# Patient Record
Sex: Male | Born: 1960 | Race: White | Hispanic: No | Marital: Single | Smoking: Current every day smoker
Health system: Southern US, Community
[De-identification: ages and names within clinical notes are randomized; demographics above are authoritative.]

## PROBLEM LIST (undated history)

## (undated) DIAGNOSIS — I10 Essential (primary) hypertension: Secondary | ICD-10-CM

## (undated) DIAGNOSIS — E119 Type 2 diabetes mellitus without complications: Secondary | ICD-10-CM

## (undated) HISTORY — PX: COLON SURGERY: SHX602

---

## 2015-12-26 ENCOUNTER — Emergency Department (HOSPITAL_COMMUNITY): Payer: Self-pay

## 2015-12-26 ENCOUNTER — Emergency Department (HOSPITAL_COMMUNITY)
Admission: EM | Admit: 2015-12-26 | Discharge: 2015-12-26 | Disposition: A | Discharge status: Home / Self Care | Payer: Self-pay | Attending: Emergency Medicine | Admitting: Emergency Medicine

## 2015-12-26 ENCOUNTER — Encounter (HOSPITAL_COMMUNITY): Payer: Self-pay | Admitting: Neurology

## 2015-12-26 DIAGNOSIS — IMO0002 Reserved for concepts with insufficient information to code with codable children: Secondary | ICD-10-CM

## 2015-12-26 DIAGNOSIS — R531 Weakness: Secondary | ICD-10-CM

## 2015-12-26 DIAGNOSIS — F17200 Nicotine dependence, unspecified, uncomplicated: Secondary | ICD-10-CM | POA: Insufficient documentation

## 2015-12-26 DIAGNOSIS — E1365 Other specified diabetes mellitus with hyperglycemia: Secondary | ICD-10-CM

## 2015-12-26 DIAGNOSIS — R112 Nausea with vomiting, unspecified: Secondary | ICD-10-CM | POA: Insufficient documentation

## 2015-12-26 DIAGNOSIS — I10 Essential (primary) hypertension: Secondary | ICD-10-CM | POA: Insufficient documentation

## 2015-12-26 DIAGNOSIS — R1084 Generalized abdominal pain: Secondary | ICD-10-CM

## 2015-12-26 DIAGNOSIS — R0602 Shortness of breath: Secondary | ICD-10-CM | POA: Insufficient documentation

## 2015-12-26 DIAGNOSIS — R1111 Vomiting without nausea: Secondary | ICD-10-CM

## 2015-12-26 DIAGNOSIS — E139 Other specified diabetes mellitus without complications: Secondary | ICD-10-CM | POA: Insufficient documentation

## 2015-12-26 HISTORY — DX: Essential (primary) hypertension: I10

## 2015-12-26 HISTORY — DX: Type 2 diabetes mellitus without complications: E11.9

## 2015-12-26 LAB — URINALYSIS, ROUTINE W REFLEX MICROSCOPIC
BILIRUBIN URINE: NEGATIVE
Glucose, UA: >1000 mg/dL — AB
HGB URINE DIPSTICK: NEGATIVE
KETONES UR: NEGATIVE mg/dL
Leukocytes, UA: NEGATIVE
NITRITE: NEGATIVE
PH: 5 (ref 5.0–8.0)
Protein, ur: 100 mg/dL — AB
Specific Gravity, Urine: 1.027 (ref 1.005–1.030)

## 2015-12-26 LAB — COMPREHENSIVE METABOLIC PANEL
ALBUMIN: 3.5 g/dL (ref 3.5–5.0)
ALK PHOS: 92 U/L (ref 38–126)
ALT: 10 U/L — ABNORMAL LOW (ref 17–63)
AST: 14 U/L — AB (ref 15–41)
Anion gap: 11 (ref 5–15)
BILIRUBIN TOTAL: 0.4 mg/dL (ref 0.3–1.2)
BUN: 19 mg/dL (ref 6–20)
CALCIUM: 9 mg/dL (ref 8.9–10.3)
CO2: 23 mmol/L (ref 22–32)
Chloride: 102 mmol/L (ref 101–111)
Creatinine, Ser: 1.1 mg/dL (ref 0.61–1.24)
GFR calc Af Amer: >60 mL/min (ref >60)
GFR calc non Af Amer: >60 mL/min (ref >60)
GLUCOSE: 273 mg/dL — AB (ref 65–99)
POTASSIUM: 3.5 mmol/L (ref 3.5–5.1)
Sodium: 136 mmol/L (ref 135–145)
TOTAL PROTEIN: 6.4 g/dL — AB (ref 6.5–8.1)

## 2015-12-26 LAB — CBC
HEMATOCRIT: 38.6 % — AB (ref 39.0–52.0)
Hemoglobin: 12.7 g/dL — ABNORMAL LOW (ref 13.0–17.0)
MCH: 29.4 pg (ref 26.0–34.0)
MCHC: 32.9 g/dL (ref 30.0–36.0)
MCV: 89.4 fL (ref 78.0–100.0)
Platelets: 141 10*3/uL — ABNORMAL LOW (ref 150–400)
RBC: 4.32 MIL/uL (ref 4.22–5.81)
RDW: 13 % (ref 11.5–15.5)
WBC: 6.4 10*3/uL (ref 4.0–10.5)

## 2015-12-26 LAB — URINE MICROSCOPIC-ADD ON: Bacteria, UA: NONE SEEN

## 2015-12-26 LAB — BRAIN NATRIURETIC PEPTIDE: B Natriuretic Peptide: 508.7 pg/mL — ABNORMAL HIGH (ref 0.0–100.0)

## 2015-12-26 LAB — I-STAT TROPONIN, ED: Troponin i, poc: 0.01 ng/mL (ref 0.00–0.08)

## 2015-12-26 LAB — LIPASE, BLOOD: Lipase: 20 U/L (ref 11–51)

## 2015-12-26 MED ORDER — ASPIRIN 81 MG PO CHEW
324.0000 mg | CHEWABLE_TABLET | Freq: Once | ORAL | Status: AC
  Administered 2015-12-26: 324 mg via ORAL
  Filled 2015-12-26: qty 4

## 2015-12-26 NOTE — ED Notes (Signed)
Patient comes in today with c/o abd pain, nausea, vomiting, right chest pain, shortness of breath, and bilateral leg pain. Nausea and vomiting started 3 weeks ago. In the last 24 hours, patient has vomited twice. Patient states vomited is brown. Patient states the pain started 2 days ago and gets worse everyday. Rated a 9 on a scale of 0-10. LBM yesterday. Hx of colon surgery in 2008. Leg pain starts in groin and radiates down to his toes. Denies injury, work related problems, or eating anything strange.

## 2015-12-26 NOTE — ED Triage Notes (Signed)
Pt here c/o bilateral leg cramps, lower abd pain. Has had n/v every morning x 1 week. Last BM was yesterday.

## 2015-12-26 NOTE — ED Provider Notes (Signed)
[facility]-EMERGENCY DEPT Provider Note   CSN: [id number] Arrival date & time: 12/26/15  1034     History   Chief Complaint Chief Complaint  Patient presents with  . Leg Pain  . Abdominal Pain    HPI Luis Lucas is a 55 y.o. 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 Pt states that he has been having cramping in his bilateral groin and top of legs as well as lower abdominal pain worsening over 1 week. Pt also states that he has vomiting at least 1 times per day for the last week. No current nausea. Pt states that he has had abdominal pain since his gall bladder surgery in 2004 but it has been worse in the past week.  Per pts brother, pt recently moved in with him. Pt has been complaining of intermittent R sided chest pain and has appeared more short of breath. Per brother, pt cannot walk to the mailbox without getting short of breath and weak and having to sit down.   HPI  Past Medical History:  Diagnosis Date  . Diabetes mellitus without complication (HCC)   . Hypertension     There are no active problems to display for this patient.   Past Surgical History:  Procedure Laterality Date  . COLON SURGERY         Home Medications    Prior to Admission medications   Medication Sig Start Date End Date Taking? Authorizing Provider  losartan (COZAAR) 50 MG tablet Take 50 mg by mouth daily. 12/08/15  Yes Historical Provider, MD  metFORMIN (GLUCOPHAGE) 500 MG tablet Take 500 mg by mouth daily. 12/08/15  Yes Historical Provider, MD    Family History No family history on file.  Social History Social History  Substance Use Topics  . Smoking status: Current Every Day Smoker  . Smokeless tobacco: Never Used  . Alcohol use No     Allergies   Penicillins   Review of Systems Review of Systems  All other systems reviewed and are negative.    Physical Exam Updated Vital Signs BP  159/96   Pulse 77   Temp 97.6 F (36.4 C) (Oral)   Resp 13   Wt 102.1 kg   SpO2 98%   Physical Exam  Constitutional: He is oriented to person, place, and time. He appears well-developed and well-nourished. No distress.  HENT:  Head: Normocephalic and atraumatic.  Mouth/Throat: No oropharyngeal exudate.  Eyes: Conjunctivae and EOM are normal. Pupils are equal, round, and reactive to light. Right eye exhibits no discharge. Left eye exhibits no discharge. No scleral icterus.  Cardiovascular: Normal rate, regular rhythm, normal heart sounds and intact distal pulses.  Exam reveals no gallop and no friction rub.   No murmur heard. Pulmonary/Chest: Effort normal and breath sounds normal. No respiratory distress. He has no wheezes. He has no rales. He exhibits no tenderness.  Abdominal: Soft. He exhibits no distension, no fluid wave, no abdominal bruit, no ascites and no pulsatile midline mass. There is tenderness. There is no rigidity, no rebound, no guarding, no CVA tenderness, no tenderness at McBurney's point and negative Murphy's sign.    Large midline abdominal scar  Musculoskeletal: Normal range of motion. He exhibits no edema.  Neurological: He is alert and oriented to person, place, and time. No cranial nerve deficit. Coordination normal.  Strength 5/5 throughout. No sensory deficits. No gait abnormality. No dysmetria. No slurred speech.  No facial droop. Negative pronator drift.    Skin: Skin is warm and dry. No rash noted. He is not diaphoretic. No erythema. No pallor.  Psychiatric: He has a normal mood and affect. His behavior is normal.  Nursing note and vitals reviewed.    ED Treatments / Results  Labs (all labs ordered are listed, but only abnormal results are displayed) Labs Reviewed  COMPREHENSIVE METABOLIC PANEL - Abnormal; Notable for the following:       Result Value   Glucose, Bld 273 (*)    Total Protein 6.4 (*)    AST 14 (*)    ALT 10 (*)    All other components  within normal limits  CBC - Abnormal; Notable for the following:    Hemoglobin 12.7 (*)    HCT 38.6 (*)    Platelets 141 (*)    All other components within normal limits  URINALYSIS, ROUTINE W REFLEX MICROSCOPIC (NOT [facility]) - Abnormal; Notable for the following:    Glucose, UA >1000 (*)    Protein, ur 100 (*)    All other components within normal limits  URINE MICROSCOPIC-ADD ON - Abnormal; Notable for the following:    Squamous Epithelial / LPF 0-5 (*)    All other components within normal limits  BRAIN NATRIURETIC PEPTIDE - Abnormal; Notable for the following:    B Natriuretic Peptide 508.7 (*)    All other components within normal limits  LIPASE, BLOOD  I-STAT TROPOININ, ED    EKG  EKG Interpretation  Date/Time:  Wednesday December 26 2015 13:09:09 EDT Ventricular Rate:  75 PR Interval:    QRS Duration: 123 QT Interval:  412 QTC Calculation: 461 R Axis:   9 Text Interpretation:  Sinus rhythm Left bundle branch block Nonspecific ST and T wave abnormality No previous tracing Confirmed by KNOTT MD, DANIEL [phone]([id number]) on 12/26/2015 1:15:52 PM       Radiology Dg Chest 2 View  Result Date: 12/26/2015 CLINICAL DATA:  Chest pain. EXAM: CHEST  2 VIEW COMPARISON:  None. FINDINGS: The heart size and mediastinal contours are within normal limits. Both lungs are clear. No pneumothorax or pleural effusion is noted. The visualized skeletal structures are unremarkable. IMPRESSION: No active cardiopulmonary disease. Electronically Signed   By: Lupita RaiderJames  Green Jr, M.D.   On: 12/26/2015 14:00    Procedures Procedures (including critical care time)  Medications Ordered in ED Medications  aspirin chewable tablet 324 mg (324 mg Oral Given 12/26/15 1419)     Initial Impression / Assessment and Plan / ED Course  I have reviewed the triage vital signs and the nursing notes.  Pertinent labs & imaging results that were available during my care of the patient were reviewed by me and considered in  my medical decision making (see chart for details).  Clinical Course   55 y.o M with a pmhx of uncontrolled DM, HTN who presents to the ED today with multiple complaints. Pt states that he has been having worsening weakness, intermittent CP, DOE, lower abdominal pain and vomiting daily for the past week. On presentation to ED, pt appears well, in NAD. All VSS. No sign of respiratory distress. Pt has diffuse tenderness across his lower abdomen, but abd is soft, no rigidity or rebound tenderness. 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 No leukocytosis. Pt afebrile. No sign of infection.  No indication of appendicitis, bowel  obstruction, bowel perforation, cholecystitis, diverticulitis. No sign of DKA. No anion gap. No ketonuria. No episodes of emesis while in the ED. Pt is tolerating fluids, >6 oz in ED. Pt denying any active CP or difficulty breathing. EKG shows left bundle branch block. No previous one to compare this to. Initial troponin 0. Pt does have moderate risk HEART score. However, he is currently symptom free. Do not suspect any kind of dissection or pericarditis. BNP mildly elevated. No sign of pulmonary edema on CXR. Feel that pt may follow up as an outpatient with cardiology as well as his PCP regarding these issues. All other lab work unremarkable. Pt is ambulatory around the ED without difficulty, not requiring rest. Patient discharged home and given strict instructions for follow-up.  I have also discussed reasons to return immediately to the ER.  Patient expresses understanding and agrees with plan.  Patient was discussed with and seen by Dr. Clydene PughKnott who agrees with the treatment plan.      Final Clinical Impressions(s) / ED Diagnoses   Final diagnoses:  Generalized abdominal pain  Weakness  DM (diabetes mellitus), secondary uncontrolled (HCC)  Non-intractable vomiting without nausea, vomiting of  unspecified type    New Prescriptions New Prescriptions   No medications on file     Dub MikesSamantha Tripp Dowless, PA-C 12/30/15 [id number]    Lyndal Pulleyaniel Knott, MD 12/31/15 1053

## 2015-12-26 NOTE — ED Notes (Addendum)
IV removed from right forearm.

## 2015-12-26 NOTE — Discharge Instructions (Signed)
Please schedule an appointment to see cardiology as well as your primary care provider. Encourage smoking cessation. Eat a healthy diet and exercise regularly. Return to the ED if you experience loss of consciousness, blurred vision, inability to have a bowel movement, increased chest pain or difficulty breathing.

## 2017-10-04 IMAGING — DX DG CHEST 2V
2 series · 2 of 2 positions shown · non-contrast
Comparison: None.

CLINICAL DATA: Chest pain.

EXAM:
CHEST  2 VIEW

[chest pa]
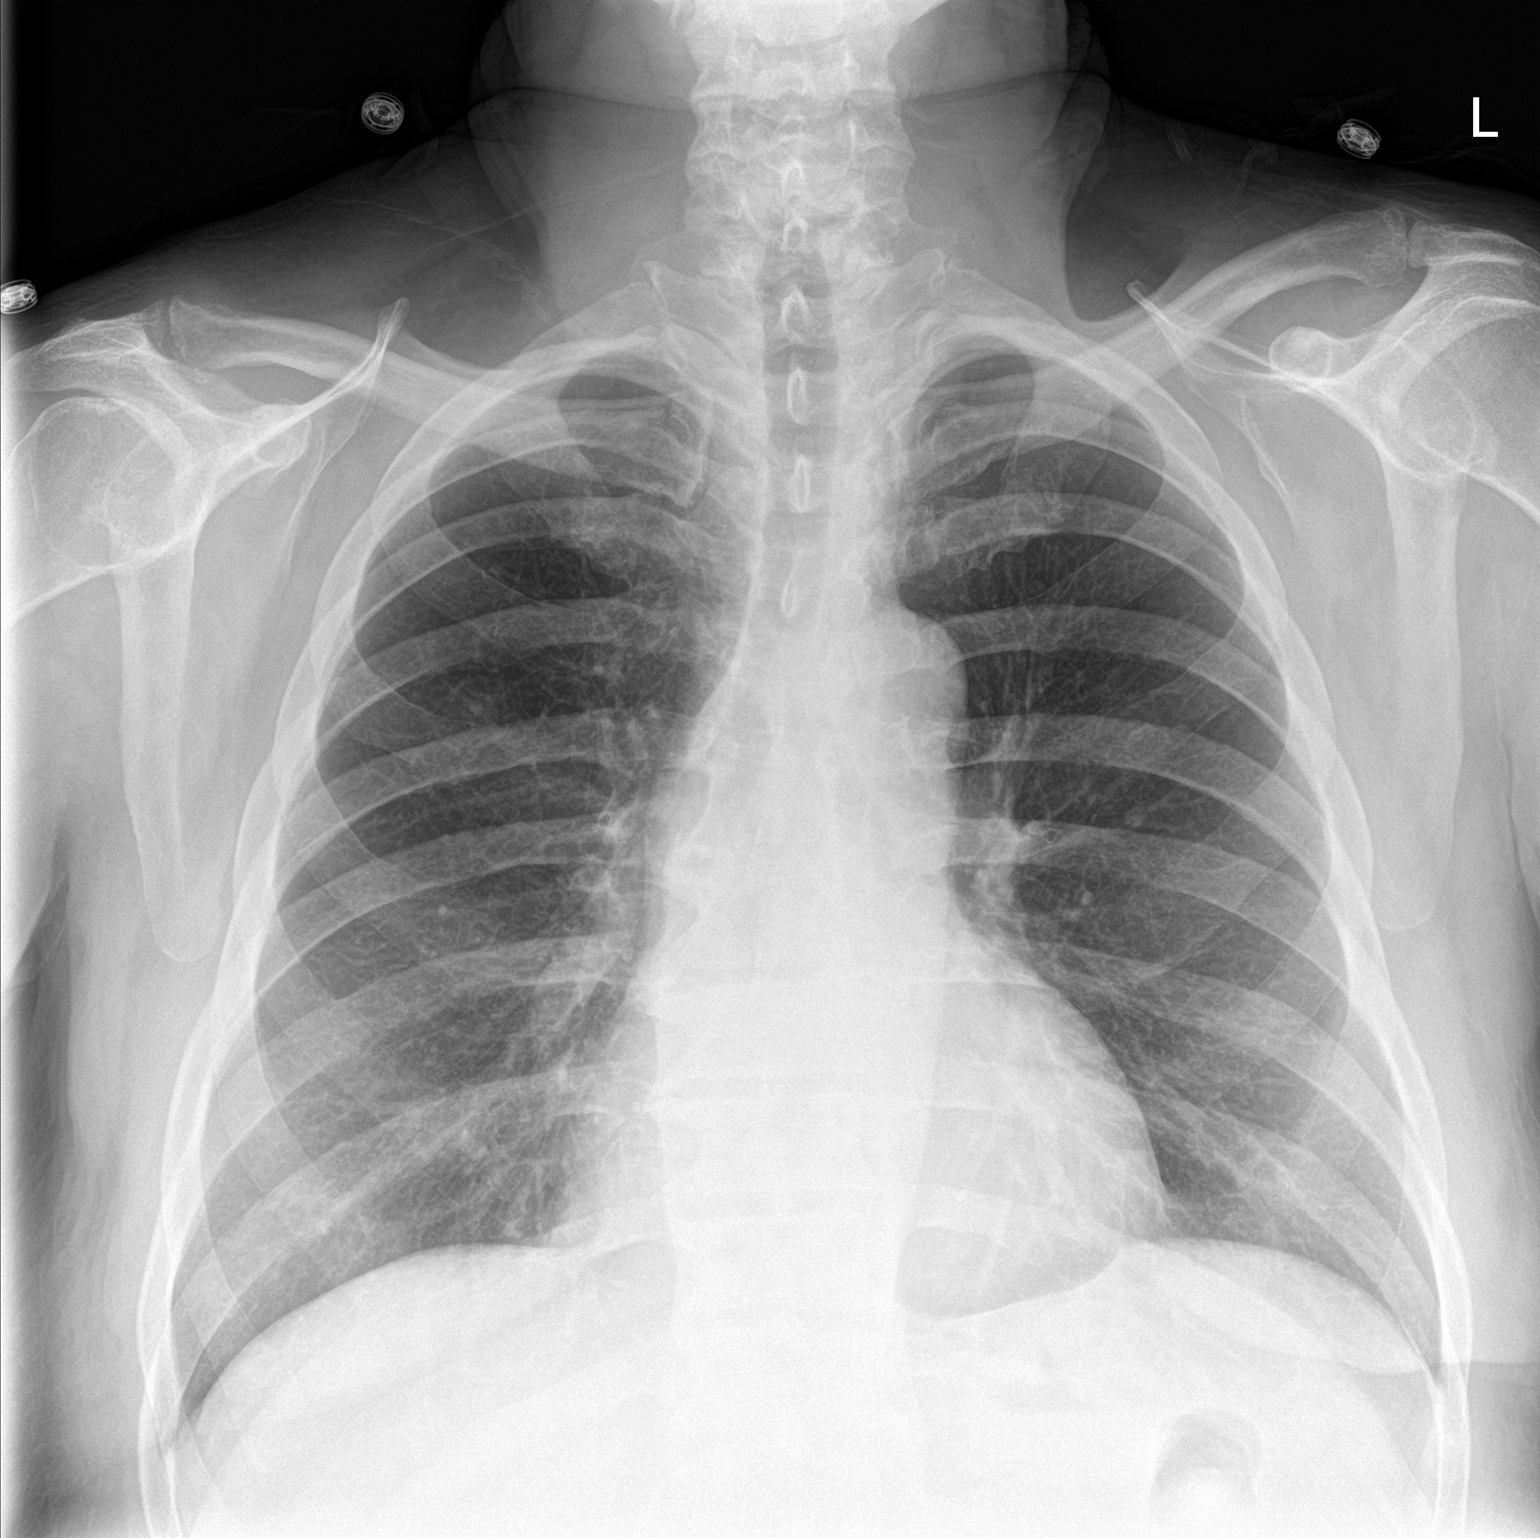

[chest lat]
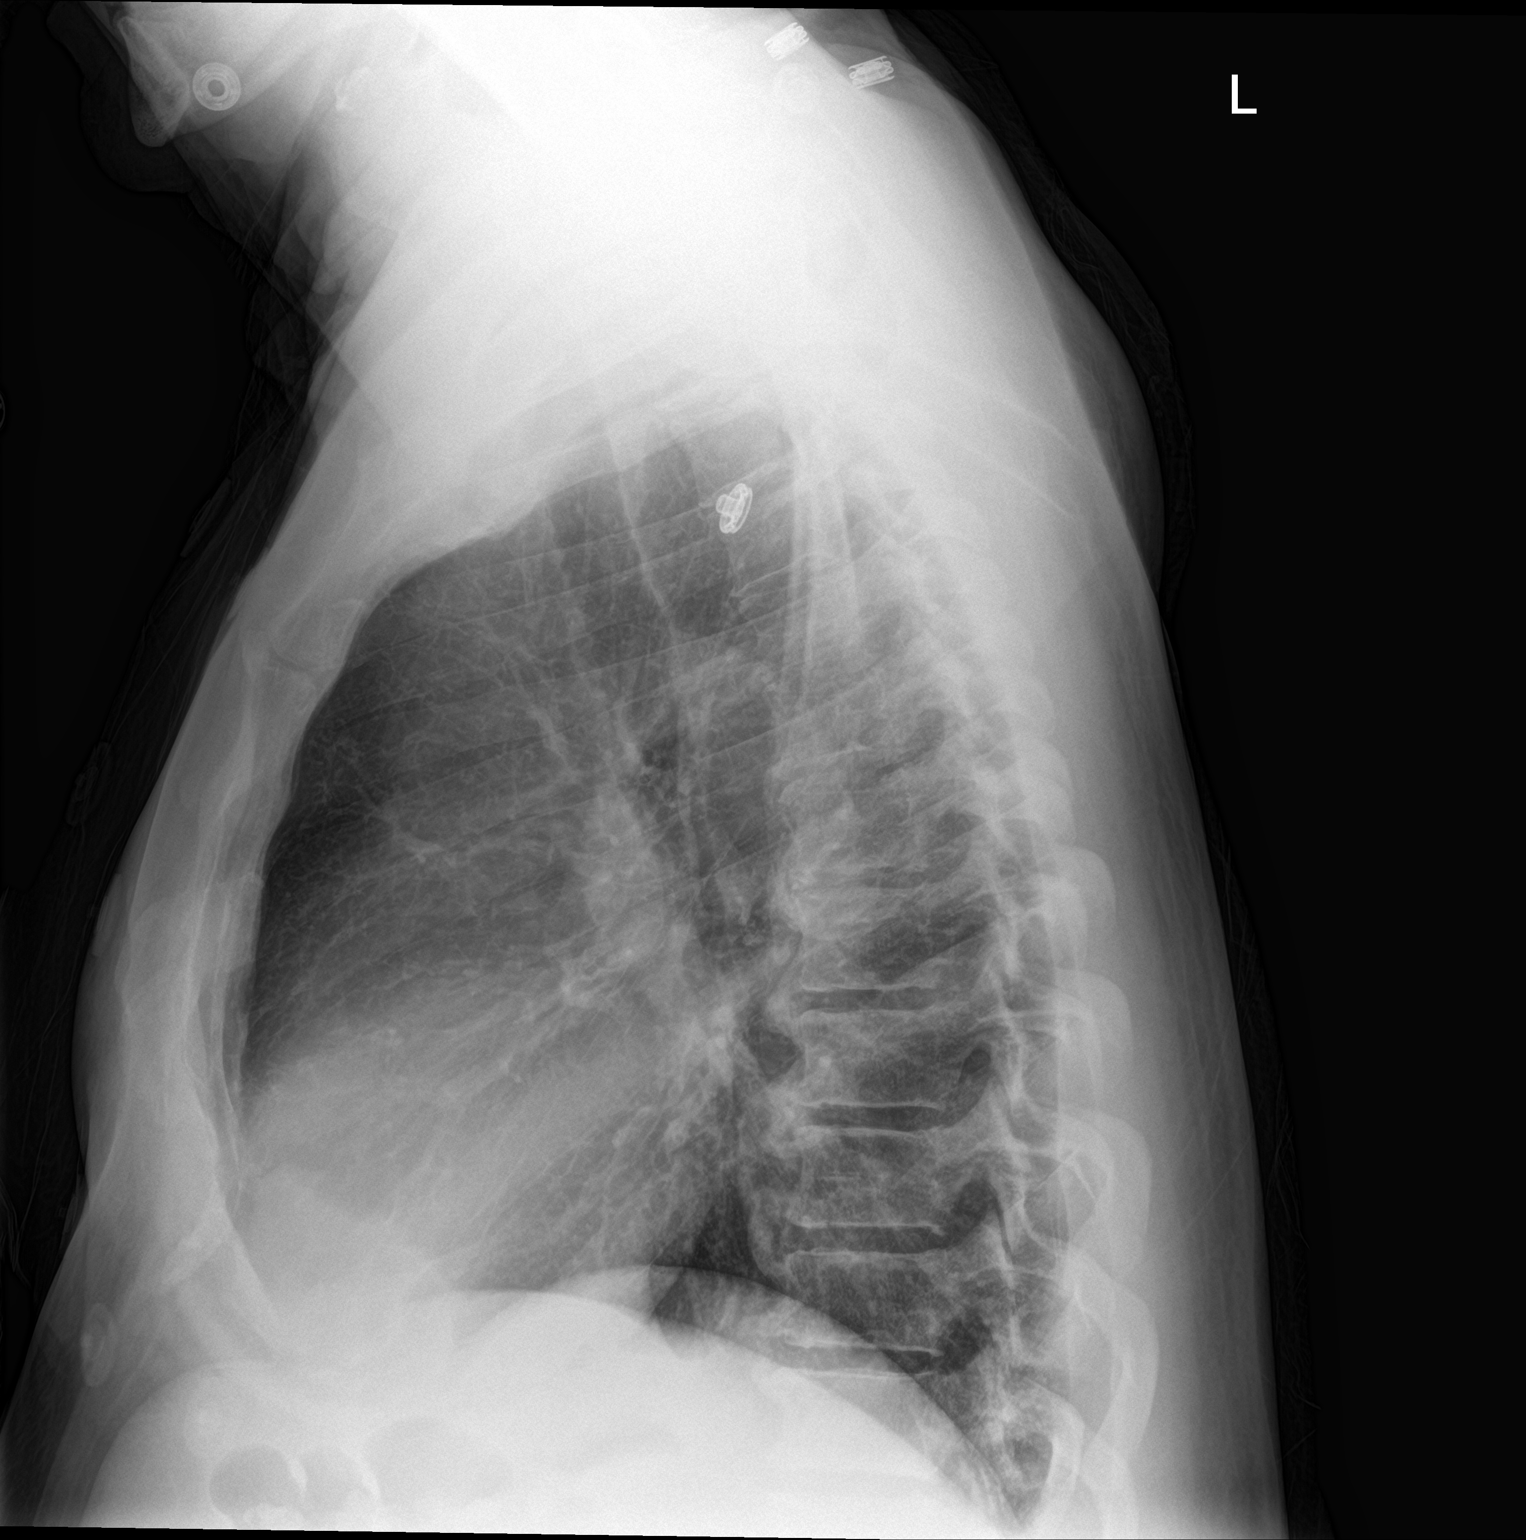

[2 of 2 positions shown; findings below may reference images not displayed]

FINDINGS: The heart size and mediastinal contours are within normal limits.
Both lungs are clear. No pneumothorax or pleural effusion is noted.
The visualized skeletal structures are unremarkable.
IMPRESSION: No active cardiopulmonary disease.

## 2019-08-04 DEATH — deceased
